# Patient Record
Sex: Male | Born: 1975 | Race: White | Hispanic: No | Marital: Married | Smoking: Never smoker
Health system: Southern US, Community
[De-identification: ages and names within clinical notes are randomized; demographics above are authoritative.]

## PROBLEM LIST (undated history)

## (undated) DIAGNOSIS — Z87442 Personal history of urinary calculi: Secondary | ICD-10-CM

## (undated) HISTORY — PX: NASAL SINUS SURGERY: SHX719

---
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
## 2007-04-30 ENCOUNTER — Ambulatory Visit: Payer: Self-pay | Admitting: Cardiology

## 2007-05-08 ENCOUNTER — Ambulatory Visit: Payer: Self-pay

## 2007-07-15 ENCOUNTER — Encounter: Payer: Self-pay | Admitting: Internal Medicine

## 2009-04-22 ENCOUNTER — Encounter: Admission: RE | Admit: 2009-04-22 | Discharge: 2009-04-22 | Payer: Self-pay | Admitting: Otolaryngology

## 2010-11-29 NOTE — Assessment & Plan Note (Signed)
[facility]                            CARDIOLOGY OFFICE NOTE   Mike Medina, Mike Medina                    MRN:          [id number]  DATE:04/30/2007                            DOB:          01/08/1976    CHIEF COMPLAINT:  Chest tightness and pressure nine months ago and  recently.   HISTORY OF PRESENT ILLNESS:  The patient is a very pleasant 35 year old  married white male who comes in with the above complaint.  About nine  months ago he had chest pressure and heaviness associated with feeling  very cold and shaky.  He did not bother to get this assessed.   He saw Dr. Patsy Lager yesterday at [facility] because of  recurrent chest pressure in the center of his chest that is not well  localized.  It comes on at rest.  It is not exertional related.  It does  not radiate.  It is not associated with any nausea, vomiting, or  diaphoresis.  He has no GI symptomatology including dyspepsia,  dysphagia, melena, or hematochezia.   He has virtually no risk factors at this point in time.  He is a little  bit overweight and he does not know his lipid status which was drawn  yesterday.  Specifically, he does not have hypertension, he does not  smoke, does not use recreational products.  He does not have a premature  family history and is not diabetic.   ALLERGIES:  No known drug allergies.   PAST SURGICAL HISTORY:  None.   FAMILY HISTORY:  Negative for premature coronary disease.   CURRENT MEDICATIONS:  Tylenol PM p.r.n.   SOCIAL HISTORY:  He is a Radiation protection practitioner.  He does retirement  planning.  He is obviously under a lot of stress at the present  conditions of our society.  He is married and has two children.   REVIEW OF SYSTEMS:  Other than the HPI is negative.   PHYSICAL EXAMINATION:  VITAL SIGNS:  Blood pressure 98/62, pulse 55 and  regular, EKG shows sinus brady with an SRS prime in only V1. There is no  change from his ECG at [facility].  He is 5 feet 11 inches and  weighs 200 pounds.  HEENT:  Normocephalic and atraumatic.  PERRLA.  Extraocular movements  intact.  Sclerae clear.  Facial symmetry is normal.  Dentition is  satisfactory.  NECK:  Supple.  Carotid upstrokes are equal bilaterally without bruits,  no JVD.  Thyroid is not enlarged.  Trachea is midline.  LUNGS:  Clear.  HEART:  Regular rate and rhythm.  Normal S1 and S2.  No click.  ABDOMEN:  Soft with good bowel sounds.  No midline bruits.  There is no  hepatomegaly.  There is no epigastric or right upper quadrant  tenderness.  EXTREMITIES:  No cyanosis, clubbing, or edema.  Pulses are intact.  NEUROLOGY:  Intact.  SKIN:  Unremarkable.   ASSESSMENT:  Chest tightness and pressure which is probably noncardiac.  No significant cardiac risk factors and this is not associated with  exertion.   He  is very concerned as is his wife.  I think objective assessment would  give him reassurance and also hopefully clear him to do an exercise  program.   PLAN:  Exercise rest stress Myoview.  If this is negative we will see  him back on a p.r.n. basis.  He will follow up his lipid status with Dr.  Patsy Lager.     Thomas C. Daleen Squibb, MD, [facility]  Electronically Signed    TCW/MedQ  DD: 04/30/2007  DT: 05/01/2007  Job #: [id number]   cc:   Pearline Cables, MD

## 2013-02-04 ENCOUNTER — Ambulatory Visit (INDEPENDENT_AMBULATORY_CARE_PROVIDER_SITE_OTHER): Payer: BC Managed Care – PPO | Admitting: Physician Assistant

## 2013-02-04 VITALS — BP 122/76 | HR 62 | Temp 98.4°F | Resp 18 | Ht 71.0 in | Wt 284.0 lb

## 2013-02-04 DIAGNOSIS — L03039 Cellulitis of unspecified toe: Secondary | ICD-10-CM

## 2013-02-04 DIAGNOSIS — L03031 Cellulitis of right toe: Secondary | ICD-10-CM

## 2013-02-04 MED ORDER — CEPHALEXIN 500 MG PO CAPS: 500.0000 mg | ORAL_CAPSULE | Freq: Three times a day (TID) | ORAL | Status: DC

## 2013-02-04 NOTE — Patient Instructions (Addendum)
Stop taking the amoxicillin.  Begin taking the Keflex three times daily.  Be sure to finish the full course as directed!  Continue soaking the toe - this will facilitate drainage of the area.  If anything is worsening or not improving, please let us  Know    Paronychia Paronychia is an inflammatory reaction involving the folds of the skin surrounding the fingernail. This is commonly caused by an infection in the skin around a nail. The most common cause of paronychia is frequent wetting of the hands (as seen with bartenders, food servers, nurses or others who wet their hands). This makes the skin around the fingernail susceptible to infection by bacteria (germs) or fungus. Other predisposing factors are:  Aggressive manicuring.  Nail biting.  Thumb sucking. The most common cause is a staphylococcal (a type of germ) infection, or a fungal (Candida) infection. When caused by a germ, it usually comes on suddenly with redness, swelling, pus and is often painful. It may get under the nail and form an abscess (collection of pus), or form an abscess around the nail. If the nail itself is infected with a fungus, the treatment is usually prolonged and may require oral medicine for up to one year. Your caregiver will determine the length of time treatment is required. The paronychia caused by bacteria (germs) may largely be avoided by not pulling on hangnails or picking at cuticles. When the infection occurs at the tips of the finger it is called felon. When the cause of paronychia is from the herpes simplex virus (HSV) it is called herpetic whitlow. TREATMENT  When an abscess is present treatment is often incision and drainage. This means that the abscess must be cut open so the pus can get out. When this is done, the following home care instructions should be followed. HOME CARE INSTRUCTIONS   It is important to keep the affected fingers very dry. Rubber or plastic gloves over cotton gloves should be used  whenever the hand must be placed in water.  Keep wound clean, dry and dressed as suggested by your caregiver between warm soaks or warm compresses.  Soak in warm water for fifteen to twenty minutes three to four times per day for bacterial infections. Fungal infections are very difficult to treat, so often require treatment for long periods of time.  For bacterial (germ) infections take antibiotics (medicine which kill germs) as directed and finish the prescription, even if the problem appears to be solved before the medicine is gone.  Only take over-the-counter or prescription medicines for pain, discomfort, or fever as directed by your caregiver. SEEK IMMEDIATE MEDICAL CARE IF:  You have redness, swelling, or increasing pain in the wound.  You notice pus coming from the wound.  You have a fever.  You notice a bad smell coming from the wound or dressing. Document Released: 12/27/2000 Document Revised: 09/25/2011 Document Reviewed: 08/28/2008 [facility] Patient Information 2014 [place], [place].

## 2013-02-04 NOTE — Progress Notes (Signed)
  Subjective:    Patient ID: Mike Medina, male    DOB: [birth year], 37 y.o.   MRN: [id number]  HPI   Mike Medina is a very pleasant 36 yr old male here with concern for infection in his right great toe.  It has been sore x 1 week.  Tenderness is mainly located at medial nail margin, but occasionally that whole toe is painful with throbbing into the foot - esp after he has been up on his feet all day.  Has been doing epsom salt soaks.  Has noted some purulent drainage.  Has never had anything like this before.  Does think he is improving somewhat.  Denies injury to the toe.  Denies ingrown toenails.      Review of Systems  Constitutional: Negative for fever and chills.  HENT: Negative.   Respiratory: Negative.   Cardiovascular: Negative.   Gastrointestinal: Negative.   Musculoskeletal: Positive for arthralgias (right great toe).  Skin: Positive for color change (right great toe).  Neurological: Negative.        Objective:   Physical Exam  Vitals reviewed. Constitutional: He is oriented to person, place, and time. He appears well-developed and well-nourished. No distress.  HENT:  Head: Normocephalic and atraumatic.  Eyes: Conjunctivae are normal. No scleral icterus.  Cardiovascular:  Pulses:      Dorsalis pedis pulses are 2+ on the right side, and 2+ on the left side.       Posterior tibial pulses are 2+ on the right side, and 2+ on the left side.  Pulmonary/Chest: Effort normal.  Musculoskeletal:       Right foot: He exhibits tenderness. He exhibits normal range of motion, no bony tenderness, no swelling and normal capillary refill.       Feet:  Area of tenderness, erythema, and swelling at medial nail border; no fluctuance or warmth; no evidence of ingrowing nails  Neurological: He is alert and oriented to person, place, and time.  Skin: Skin is warm and dry.  Psychiatric: He has a normal mood and affect. His behavior is normal.         Assessment & Plan:  Paronychia  of great toe of right foot - Plan: cephALEXin (KEFLEX) 500 MG capsule   Mike Medina is a 37 yr old male with paronychia of the right great toe.  Because there is no area of discrete fluctuance, I have opted not to open the area today.  Will start keflex TID.  Continue soaks.  Discussed RTC precautions with pt who understands and is in agreement with this plan

## 2016-02-25 ENCOUNTER — Emergency Department (HOSPITAL_COMMUNITY)
Admission: EM | Admit: 2016-02-25 | Discharge: 2016-02-25 | Disposition: A | Discharge status: Home / Self Care | Payer: BLUE CROSS/BLUE SHIELD | Attending: Emergency Medicine | Admitting: Emergency Medicine

## 2016-02-25 ENCOUNTER — Emergency Department (HOSPITAL_COMMUNITY): Payer: BLUE CROSS/BLUE SHIELD

## 2016-02-25 DIAGNOSIS — N1330 Unspecified hydronephrosis: Secondary | ICD-10-CM | POA: Insufficient documentation

## 2016-02-25 DIAGNOSIS — K8020 Calculus of gallbladder without cholecystitis without obstruction: Secondary | ICD-10-CM | POA: Insufficient documentation

## 2016-02-25 DIAGNOSIS — N201 Calculus of ureter: Secondary | ICD-10-CM | POA: Diagnosis not present

## 2016-02-25 DIAGNOSIS — N200 Calculus of kidney: Secondary | ICD-10-CM

## 2016-02-25 DIAGNOSIS — N134 Hydroureter: Secondary | ICD-10-CM | POA: Diagnosis not present

## 2016-02-25 DIAGNOSIS — K7689 Other specified diseases of liver: Secondary | ICD-10-CM

## 2016-02-25 DIAGNOSIS — R1031 Right lower quadrant pain: Secondary | ICD-10-CM | POA: Diagnosis present

## 2016-02-25 DIAGNOSIS — K760 Fatty (change of) liver, not elsewhere classified: Secondary | ICD-10-CM

## 2016-02-25 LAB — URINALYSIS, ROUTINE W REFLEX MICROSCOPIC
BILIRUBIN URINE: NEGATIVE
Glucose, UA: NEGATIVE mg/dL
HGB URINE DIPSTICK: NEGATIVE
Ketones, ur: NEGATIVE mg/dL
Leukocytes, UA: NEGATIVE
Nitrite: NEGATIVE
PH: 7.5 (ref 5.0–8.0)
Protein, ur: 30 mg/dL — AB
SPECIFIC GRAVITY, URINE: 1.027 (ref 1.005–1.030)

## 2016-02-25 LAB — COMPREHENSIVE METABOLIC PANEL
ALBUMIN: 4.9 g/dL (ref 3.5–5.0)
ALK PHOS: 62 U/L (ref 38–126)
ALT: 47 U/L (ref 17–63)
ANION GAP: 13 (ref 5–15)
AST: 33 U/L (ref 15–41)
BUN: 16 mg/dL (ref 6–20)
CALCIUM: 9.8 mg/dL (ref 8.9–10.3)
CO2: 23 mmol/L (ref 22–32)
Chloride: 106 mmol/L (ref 101–111)
Creatinine, Ser: 1.38 mg/dL — ABNORMAL HIGH (ref 0.61–1.24)
GFR calc Af Amer: >60 mL/min (ref >60)
GFR calc non Af Amer: >60 mL/min (ref >60)
GLUCOSE: 159 mg/dL — AB (ref 65–99)
Potassium: 3.8 mmol/L (ref 3.5–5.1)
SODIUM: 142 mmol/L (ref 135–145)
Total Bilirubin: 1 mg/dL (ref 0.3–1.2)
Total Protein: 7 g/dL (ref 6.5–8.1)

## 2016-02-25 LAB — CBC
HEMATOCRIT: 46.5 % (ref 39.0–52.0)
HEMOGLOBIN: 15.9 g/dL (ref 13.0–17.0)
MCH: 29.6 pg (ref 26.0–34.0)
MCHC: 34.2 g/dL (ref 30.0–36.0)
MCV: 86.4 fL (ref 78.0–100.0)
Platelets: 214 10*3/uL (ref 150–400)
RBC: 5.38 MIL/uL (ref 4.22–5.81)
RDW: 12.8 % (ref 11.5–15.5)
WBC: 7 10*3/uL (ref 4.0–10.5)

## 2016-02-25 LAB — LIPASE, BLOOD: Lipase: 30 U/L (ref 11–51)

## 2016-02-25 LAB — URINE MICROSCOPIC-ADD ON

## 2016-02-25 MED ORDER — OXYCODONE-ACETAMINOPHEN 5-325 MG PO TABS
ORAL_TABLET | ORAL | Status: AC
  Filled 2016-02-25: qty 1

## 2016-02-25 MED ORDER — ONDANSETRON 4 MG PO TBDP
4.0000 mg | ORAL_TABLET | Freq: Once | ORAL | Status: AC | PRN
  Administered 2016-02-25: 4 mg via ORAL

## 2016-02-25 MED ORDER — FENTANYL CITRATE (PF) 100 MCG/2ML IJ SOLN
50.0000 ug | Freq: Once | INTRAMUSCULAR | Status: AC
  Administered 2016-02-25: 50 ug via INTRAVENOUS

## 2016-02-25 MED ORDER — HYDROCODONE-ACETAMINOPHEN 5-325 MG PO TABS
1.0000 | ORAL_TABLET | Freq: Four times a day (QID) | ORAL | 0 refills | Status: DC | PRN
Start: 2016-02-25 — End: 2016-04-19

## 2016-02-25 MED ORDER — HYDROCODONE-ACETAMINOPHEN 5-325 MG PO TABS
1.0000 | ORAL_TABLET | Freq: Once | ORAL | Status: AC
  Administered 2016-02-25: 1 via ORAL
  Filled 2016-02-25: qty 1

## 2016-02-25 MED ORDER — ONDANSETRON 4 MG PO TBDP
ORAL_TABLET | ORAL | Status: AC
  Filled 2016-02-25: qty 1

## 2016-02-25 MED ORDER — FENTANYL CITRATE (PF) 100 MCG/2ML IJ SOLN
INTRAMUSCULAR | Status: AC
  Filled 2016-02-25: qty 2

## 2016-02-25 MED ORDER — ONDANSETRON HCL 4 MG/2ML IJ SOLN
4.0000 mg | Freq: Once | INTRAMUSCULAR | Status: AC
  Administered 2016-02-25: 4 mg via INTRAVENOUS
  Filled 2016-02-25: qty 2

## 2016-02-25 MED ORDER — KETOROLAC TROMETHAMINE 30 MG/ML IJ SOLN
30.0000 mg | Freq: Once | INTRAMUSCULAR | Status: AC
  Administered 2016-02-25: 30 mg via INTRAVENOUS
  Filled 2016-02-25: qty 1

## 2016-02-25 MED ORDER — OXYCODONE-ACETAMINOPHEN 5-325 MG PO TABS
1.0000 | ORAL_TABLET | ORAL | Status: DC | PRN
  Administered 2016-02-25: 1 via ORAL

## 2016-02-25 MED ORDER — HYDROMORPHONE HCL 1 MG/ML IJ SOLN
1.0000 mg | Freq: Once | INTRAMUSCULAR | Status: AC
  Administered 2016-02-25: 1 mg via INTRAVENOUS
  Filled 2016-02-25: qty 1

## 2016-02-25 MED ORDER — MORPHINE SULFATE (PF) 4 MG/ML IV SOLN
4.0000 mg | Freq: Once | INTRAVENOUS | Status: AC
  Administered 2016-02-25: 4 mg via INTRAVENOUS
  Filled 2016-02-25: qty 1

## 2016-02-25 MED ORDER — ONDANSETRON HCL 4 MG PO TABS: 4.0000 mg | ORAL_TABLET | Freq: Four times a day (QID) | ORAL | 0 refills | Status: DC

## 2016-02-25 NOTE — ED Provider Notes (Signed)
[facility]-EMERGENCY DEPT Provider Note   CSN: [id number] Arrival date & time: 02/25/16  1734  First Provider Contact:  First MD Initiated Contact with Patient 02/25/16 1859        History   Chief Complaint Chief Complaint  Patient presents with  . Abdominal Pain    HPI Mike Medina is a 40 y.o. male.  Patient presents to the ED with a chief complaint of right flank and abdominal pain that started at 3pm today.  He reports sudden onset, severe pain.  It radiates to his back, right flank, and suprapubic region.  He reports associated nausea and vomiting. He denies any hx of kidney stones.  He denies any fevers, chills, hematuria, dysuria.     The history is provided by the patient. No language interpreter was used.    No past medical history on file.  There are no active problems to display for this patient.   No past surgical history on file.     Home Medications    Prior to Admission medications   Medication Sig Start Date End Date Taking? Authorizing Provider  cephALEXin (KEFLEX) 500 MG capsule Take 1 capsule (500 mg total) by mouth 3 (three) times daily. 02/04/13   Godfrey Pick, PA-C    Family History Family History  Problem Relation Age of Onset  . Diabetes Father     Social History Social History  Substance Use Topics  . Smoking status: Never Smoker  . Smokeless tobacco: Not on file  . Alcohol use No     Allergies   Review of patient's allergies indicates no known allergies.   Review of Systems Review of Systems  Genitourinary: Positive for flank pain.  All other systems reviewed and are negative.    Physical Exam Updated Vital Signs BP 133/72 (BP Location: Right Arm)   Pulse 62   Temp 97.6 F (36.4 C) (Oral)   Resp 26   Ht  (1.803 m)   Wt 131.5 kg   SpO2 100%   BMI 40.45 kg/m   Physical Exam  Constitutional: He is oriented to person, place, and time. He appears well-developed and well-nourished.  Appears very  uncomfortable  HENT:  Head: Normocephalic and atraumatic.  Eyes: Conjunctivae and EOM are normal. Pupils are equal, round, and reactive to light. Right eye exhibits no discharge. Left eye exhibits no discharge. No scleral icterus.  Neck: Normal range of motion. Neck supple. No JVD present.  Cardiovascular: Normal rate, regular rhythm and normal heart sounds.  Exam reveals no gallop and no friction rub.   No murmur heard. Pulmonary/Chest: Effort normal and breath sounds normal. No respiratory distress. He has no wheezes. He has no rales. He exhibits no tenderness.  Abdominal: Soft. He exhibits no distension and no mass. There is tenderness. There is no rebound and no guarding.  Right CVA tenderness, right abdominal tenderness  Musculoskeletal: Normal range of motion. He exhibits no edema or tenderness.  Neurological: He is alert and oriented to person, place, and time.  Skin: Skin is warm and dry.  Psychiatric: He has a normal mood and affect. His behavior is normal. Judgment and thought content normal.  Nursing note and vitals reviewed.    ED Treatments / Results  Labs (all labs ordered are listed, but only abnormal results are displayed) Labs Reviewed  COMPREHENSIVE METABOLIC PANEL - Abnormal; Notable for the following:       Result Value   Glucose, Bld 159 (*)    Creatinine, Ser  1.38 (*)    All other components within normal limits  URINALYSIS, ROUTINE W REFLEX MICROSCOPIC (NOT [facility]) - Abnormal; Notable for the following:    Protein, ur 30 (*)    All other components within normal limits  URINE MICROSCOPIC-ADD ON - Abnormal; Notable for the following:    Squamous Epithelial / LPF 0-5 (*)    Bacteria, UA RARE (*)    All other components within normal limits  LIPASE, BLOOD  CBC    EKG  EKG Interpretation  Date/Time:  Friday February 25 2016 17:40:19 EDT Ventricular Rate:  70 PR Interval:  174 QRS Duration: 86 QT Interval:  394 QTC Calculation: 425 R Axis:   20 Text  Interpretation:  Normal sinus rhythm with sinus arrhythmia Normal ECG Confirmed by [facility] CenterMESNER MD, Barbara CowerJASON [phone]) on 02/25/2016 6:48:01 PM       Radiology No results found.  Procedures Procedures (including critical care time)  Medications Ordered in ED Medications  oxyCODONE-acetaminophen (PERCOCET/ROXICET) 5-325 MG per tablet 1 tablet (1 tablet Oral Given 02/25/16 1745)  ondansetron (ZOFRAN-ODT) 4 MG disintegrating tablet (not administered)  oxyCODONE-acetaminophen (PERCOCET/ROXICET) 5-325 MG per tablet (not administered)  fentaNYL (SUBLIMAZE) 100 MCG/2ML injection (not administered)  HYDROmorphone (DILAUDID) injection 1 mg (not administered)  ondansetron (ZOFRAN) injection 4 mg (not administered)  ondansetron (ZOFRAN-ODT) disintegrating tablet 4 mg (4 mg Oral Given 02/25/16 1745)  fentaNYL (SUBLIMAZE) injection 50 mcg (50 mcg Intravenous Given 02/25/16 1837)     Initial Impression / Assessment and Plan / ED Course  I have reviewed the triage vital signs and the nursing notes.  Pertinent labs & imaging results that were available during my care of the patient were reviewed by me and considered in my medical decision making (see chart for details).  Clinical Course    Patient with severe, sudden onset, right flank pain. Suspect kidney stone, will check CT.  CT remarkable for 2 mm kidney stone. Patient still having significant pain after 2 separate doses of 1 mg of Dilaudid.  Pain control achieved with Toradol and morphine.  Verified with Dr. Jackie PlumMesnor prior to giving Toradol, given the patient's creatinine is 1.38.  9:55 PM Patient is still feeling well. He is stable for discharge. UA is negative for infection. Recommend urology follow-up if the stone has not passed in 2 days. Patient understands and agrees the plan. Return precautions given.  Final Clinical Impressions(s) / ED Diagnoses   Final diagnoses:  Kidney stone  Calculus of gallbladder without cholecystitis without  obstruction  Hepatic cyst  Hepatic steatosis    New Prescriptions New Prescriptions   HYDROCODONE-ACETAMINOPHEN (NORCO/VICODIN) 5-325 MG TABLET    Take 1-2 tablets by mouth every 6 (six) hours as needed for severe pain.   ONDANSETRON (ZOFRAN) 4 MG TABLET    Take 1 tablet (4 mg total) by mouth every 6 (six) hours.     Mike HorsemanRobert Cochise Dinneen, PA-C 02/25/16 2155    Marily MemosJason Mesner, MD 02/26/16 Moses [email or web address]

## 2016-02-25 NOTE — ED Triage Notes (Signed)
Pt reports a sudden onset of RLQ abdominal pain radiating right flank. Pt denies dysuria, hematuria. Pt denies hx of the same. Pt reports vomiting x 3 today. Onset of pain started around 1530 today. Pain is constant

## 2016-02-25 NOTE — ED Notes (Signed)
Patient transported to CT 

## 2016-03-24 ENCOUNTER — Other Ambulatory Visit: Payer: Self-pay | Admitting: Internal Medicine

## 2016-03-27 ENCOUNTER — Other Ambulatory Visit: Payer: Self-pay | Admitting: Internal Medicine

## 2016-03-27 DIAGNOSIS — K8020 Calculus of gallbladder without cholecystitis without obstruction: Secondary | ICD-10-CM

## 2016-03-27 DIAGNOSIS — K760 Fatty (change of) liver, not elsewhere classified: Secondary | ICD-10-CM

## 2016-04-04 ENCOUNTER — Ambulatory Visit
Admission: RE | Admit: 2016-04-04 | Discharge: 2016-04-04 | Disposition: A | Discharge status: Home / Self Care | Payer: BLUE CROSS/BLUE SHIELD | Source: Ambulatory Visit | Attending: Internal Medicine | Admitting: Internal Medicine

## 2016-04-04 DIAGNOSIS — K8020 Calculus of gallbladder without cholecystitis without obstruction: Secondary | ICD-10-CM

## 2016-04-04 DIAGNOSIS — K760 Fatty (change of) liver, not elsewhere classified: Secondary | ICD-10-CM

## 2016-04-10 ENCOUNTER — Encounter: Payer: Self-pay | Admitting: Gastroenterology

## 2016-04-19 ENCOUNTER — Ambulatory Visit (INDEPENDENT_AMBULATORY_CARE_PROVIDER_SITE_OTHER): Payer: BLUE CROSS/BLUE SHIELD | Admitting: Gastroenterology

## 2016-04-19 ENCOUNTER — Other Ambulatory Visit (INDEPENDENT_AMBULATORY_CARE_PROVIDER_SITE_OTHER): Payer: BLUE CROSS/BLUE SHIELD

## 2016-04-19 ENCOUNTER — Encounter: Payer: Self-pay | Admitting: Gastroenterology

## 2016-04-19 VITALS — BP 130/84 | HR 74 | Ht 70.0 in | Wt 300.0 lb

## 2016-04-19 DIAGNOSIS — R1012 Left upper quadrant pain: Secondary | ICD-10-CM

## 2016-04-19 DIAGNOSIS — R1011 Right upper quadrant pain: Secondary | ICD-10-CM | POA: Diagnosis not present

## 2016-04-19 DIAGNOSIS — R932 Abnormal findings on diagnostic imaging of liver and biliary tract: Secondary | ICD-10-CM | POA: Diagnosis not present

## 2016-04-19 LAB — COMPREHENSIVE METABOLIC PANEL
ALT: 42 U/L (ref 0–53)
AST: 29 U/L (ref 0–37)
Albumin: 4.3 g/dL (ref 3.5–5.2)
Alkaline Phosphatase: 62 U/L (ref 39–117)
BUN: 12 mg/dL (ref 6–23)
CO2: 32 meq/L (ref 19–32)
Calcium: 9.1 mg/dL (ref 8.4–10.5)
Chloride: 103 mEq/L (ref 96–112)
Creatinine, Ser: 1.04 mg/dL (ref 0.40–1.50)
GFR: 83.15 mL/min (ref >60.00)
GLUCOSE: 93 mg/dL (ref 70–99)
POTASSIUM: 4.2 meq/L (ref 3.5–5.1)
SODIUM: 141 meq/L (ref 135–145)
Total Bilirubin: 0.7 mg/dL (ref 0.2–1.2)
Total Protein: 6.6 g/dL (ref 6.0–8.3)

## 2016-04-19 NOTE — Patient Instructions (Signed)
You will have labs checked today in the basement lab.  Please head down after you check out with the front desk  (cmet). Cut back on advil. Use tylenol more for your pain. Take H2 blocker (ranitidine 150mg  pills) whenever you take advil/aleve. Call if any questions or concerns.

## 2016-04-19 NOTE — Progress Notes (Signed)
HPI: This is a     who was referred to me by Dr. Renne Crigler to evaluate  abnormal liver on CT scan, ultrasound, intermittent abdominal pain   .    Chief complaint is intermittent abdominal pain, abnormal liver imaging  Had right kidney stone aug 2017; terrible pain.  This eventually passed.  CT scan during this time suggested fatty liver and cyst in the liver. See that below.  Never saw a urologist  but his primary care physician follow-up the liver imaging with an ultrasound. See that below.  He will intermittently have position related RUQ pains.  In the past week he's had LUQ pains, sharp for the past two nights.  This is also positional. Will have to walk around to ease it back.  Also has noticed bloating in late in the day.  Weight slowly up; had been 270 for years but then up to 300 recently (30 pounds in 2 years).  Takes ibuprofen daily for sinus pains.  Normally he will take 3 pills bid.  About 3-4 days per week.  Labs 02/2016: cbc, cmet normal except Cr 1.3  Not on GERD meds, only very rarely gets pyrois  CT scan (no contrast) abd/pelvis 02/2016 for lower abd pain, n/v; IMPRESSION:1. 2 mm calculus at the right ureterovesical junction associated with hydronephrosis and hydroureter. 2. No additional intrarenal calculi or renal masses. 3. Hepatic steatosis. 4. Right hepatic cyst. 5. Cholelithiasis. 6. Normal appendix.  [place] 03/2016: for RUQ pain; IMPRESSION: 1. The gallstone questioned by recent CT within the gallbladder is not definitely seen by ultrasound, but does appear by CT to represent a real finding. 2. Echogenic liver parenchyma consistent with fatty infiltration.    Review of systems: Pertinent positive and negative review of systems were noted in the above HPI section. Complete review of systems was performed and was otherwise normal.   History reviewed. No pertinent past medical history.  Past Surgical History:  Procedure Laterality Date  . KNEE SURGERY Right 1992     Current Outpatient Prescriptions  Medication Sig Dispense Refill  . ibuprofen (ADVIL,MOTRIN) 200 MG tablet Take 800 mg by mouth every 6 (six) hours as needed.    . loratadine (CLARITIN) 10 MG tablet Take 10 mg by mouth at bedtime.    Marland Kitchen LORazepam (ATIVAN) 0.5 MG tablet Take 0.5 mg by mouth daily as needed for anxiety.     . sertraline (ZOLOFT) 100 MG tablet Take 100 mg by mouth at bedtime.     No current facility-administered medications for this visit.     Allergies as of 04/19/2016  . (No Known Allergies)    Family History  Problem Relation Age of Onset  . Osteoporosis Mother   . Colitis Mother   . Diabetes Father     Social History   Social History  . Marital status: Married    Spouse name: N/A  . Number of children: 2  . Years of education: N/A   Occupational History  . finacial advisor    Social History Main Topics  . Smoking status: Never Smoker  . Smokeless tobacco: Never Used  . Alcohol use No  . Drug use: No  . Sexual activity: Yes   Other Topics Concern  . Not on file   Social History Narrative  . No narrative on file    Physical Exam: BP 130/84   Pulse 74   Ht 5\' 10"  (1.778 m)   Wt 300 lb (136.1 kg)   BMI 43.05 kg/m  Constitutional: generally well-appearing Psychiatric: alert and oriented x3 Eyes: extraocular movements intact Mouth: oral pharynx moist, no lesions Neck: supple no lymphadenopathy Cardiovascular: heart regular rate and rhythm Lungs: clear to auscultation bilaterally Abdomen: soft, nontender, nondistended, no obvious ascites, no peritoneal signs, normal bowel sounds Extremities: no lower extremity edema bilaterally Skin: no lesions on visible extremities  Assessment and plan: 40 y.o. male with  abnormal liver on CT scan, ultrasound, intermittent left upper quadrant pains, morbid obesity  First he does have fatty liver on both CT scan and ultrasound. Fortunately based on complete metabolic profile he has no sign of liver  irritation, inflammation due to the fatty liver. His morbid obesity is certainly cause. We had a long discussion about gradual weight loss. He drinks 2 nondiet sodas a day and have was snacks after dinner often. I recommended he cut out those 2 habits and his goal should be 20-30 pounds less in 2 years.   He probably does have gallstones based on imaging but these are not causing him any symptoms. His right upper quadrant pain is very positional . His left upper quadrant pain is also positional but I do not think it is related to gallstones either. He takes 6 Advil 3 or 4 times a week and I explained to him that this is certainly a risk for gastric irritation, dyspepsia, ulcers. I recommended he try to cut back on this is best that he can, substituting a Tylenol when possible. I also recommended that if he is going to be taking NSAIDs on more than just a once a week basis he should at the same time taking H2 blocker. I see no reason for endoscopic imaging at this time. I will contact him after his complete metabolic profile is back. He is to follow-up as needed.   Rob Buntinganiel Fayne Mcguffee, MD [facility] 04/19/2016, 9:54 AM  Cc: Dr. Renne CriglerPHarr    .[email or web address]

## 2017-07-18 DIAGNOSIS — F419 Anxiety disorder, unspecified: Secondary | ICD-10-CM | POA: Diagnosis not present

## 2017-07-18 DIAGNOSIS — J309 Allergic rhinitis, unspecified: Secondary | ICD-10-CM | POA: Diagnosis not present

## 2017-10-10 ENCOUNTER — Emergency Department (HOSPITAL_COMMUNITY): Payer: BLUE CROSS/BLUE SHIELD

## 2017-10-10 ENCOUNTER — Other Ambulatory Visit: Payer: Self-pay

## 2017-10-10 ENCOUNTER — Emergency Department (HOSPITAL_COMMUNITY)
Admission: EM | Admit: 2017-10-10 | Discharge: 2017-10-10 | Disposition: A | Discharge status: Home / Self Care | Payer: BLUE CROSS/BLUE SHIELD | Attending: Physician Assistant | Admitting: Physician Assistant

## 2017-10-10 ENCOUNTER — Encounter (HOSPITAL_COMMUNITY): Payer: Self-pay | Admitting: Emergency Medicine

## 2017-10-10 DIAGNOSIS — R1012 Left upper quadrant pain: Secondary | ICD-10-CM | POA: Diagnosis not present

## 2017-10-10 DIAGNOSIS — N134 Hydroureter: Secondary | ICD-10-CM | POA: Diagnosis not present

## 2017-10-10 DIAGNOSIS — N1330 Unspecified hydronephrosis: Secondary | ICD-10-CM | POA: Diagnosis not present

## 2017-10-10 DIAGNOSIS — R1032 Left lower quadrant pain: Secondary | ICD-10-CM | POA: Diagnosis not present

## 2017-10-10 DIAGNOSIS — N201 Calculus of ureter: Secondary | ICD-10-CM | POA: Diagnosis not present

## 2017-10-10 DIAGNOSIS — R10812 Left upper quadrant abdominal tenderness: Secondary | ICD-10-CM | POA: Diagnosis not present

## 2017-10-10 DIAGNOSIS — N200 Calculus of kidney: Secondary | ICD-10-CM | POA: Diagnosis not present

## 2017-10-10 DIAGNOSIS — R1110 Vomiting, unspecified: Secondary | ICD-10-CM | POA: Diagnosis not present

## 2017-10-10 DIAGNOSIS — R112 Nausea with vomiting, unspecified: Secondary | ICD-10-CM | POA: Insufficient documentation

## 2017-10-10 DIAGNOSIS — R109 Unspecified abdominal pain: Secondary | ICD-10-CM | POA: Diagnosis not present

## 2017-10-10 LAB — URINALYSIS, ROUTINE W REFLEX MICROSCOPIC
Bilirubin Urine: NEGATIVE
Glucose, UA: NEGATIVE mg/dL
HGB URINE DIPSTICK: NEGATIVE
KETONES UR: 5 mg/dL — AB
LEUKOCYTES UA: NEGATIVE
NITRITE: NEGATIVE
PH: 5 (ref 5.0–8.0)
PROTEIN: 30 mg/dL — AB
Specific Gravity, Urine: 1.028 (ref 1.005–1.030)
WBC UA: NONE SEEN WBC/hpf (ref 0–5)

## 2017-10-10 LAB — CBC
HCT: 46.5 % (ref 39.0–52.0)
HEMOGLOBIN: 15.9 g/dL (ref 13.0–17.0)
MCH: 29.6 pg (ref 26.0–34.0)
MCHC: 34.2 g/dL (ref 30.0–36.0)
MCV: 86.6 fL (ref 78.0–100.0)
Platelets: 226 10*3/uL (ref 150–400)
RBC: 5.37 MIL/uL (ref 4.22–5.81)
RDW: 13.2 % (ref 11.5–15.5)
WBC: 9.2 10*3/uL (ref 4.0–10.5)

## 2017-10-10 LAB — LIPASE, BLOOD: Lipase: 25 U/L (ref 11–51)

## 2017-10-10 LAB — COMPREHENSIVE METABOLIC PANEL
ALK PHOS: 78 U/L (ref 38–126)
ALT: 55 U/L (ref 17–63)
ANION GAP: 14 (ref 5–15)
AST: 45 U/L — ABNORMAL HIGH (ref 15–41)
Albumin: 4.9 g/dL (ref 3.5–5.0)
BILIRUBIN TOTAL: 1.7 mg/dL — AB (ref 0.3–1.2)
BUN: 22 mg/dL — ABNORMAL HIGH (ref 6–20)
CO2: 17 mmol/L — AB (ref 22–32)
Calcium: 9.5 mg/dL (ref 8.9–10.3)
Chloride: 109 mmol/L (ref 101–111)
Creatinine, Ser: 1.46 mg/dL — ABNORMAL HIGH (ref 0.61–1.24)
GFR, EST NON AFRICAN AMERICAN: 57 mL/min — AB (ref >60)
Glucose, Bld: 148 mg/dL — ABNORMAL HIGH (ref 65–99)
Potassium: 3.5 mmol/L (ref 3.5–5.1)
SODIUM: 140 mmol/L (ref 135–145)
TOTAL PROTEIN: 7.7 g/dL (ref 6.5–8.1)

## 2017-10-10 MED ORDER — KETOROLAC TROMETHAMINE 30 MG/ML IJ SOLN
30.0000 mg | Freq: Once | INTRAMUSCULAR | Status: AC
Start: 2017-10-10 — End: 2017-10-10
  Administered 2017-10-10: 30 mg via INTRAVENOUS
  Filled 2017-10-10: qty 1

## 2017-10-10 MED ORDER — IOPAMIDOL (ISOVUE-300) INJECTION 61%
INTRAVENOUS | Status: AC
  Filled 2017-10-10: qty 100

## 2017-10-10 MED ORDER — SODIUM CHLORIDE 0.9 % IV BOLUS
1000.0000 mL | Freq: Once | INTRAVENOUS | Status: AC
  Administered 2017-10-10: 1000 mL via INTRAVENOUS

## 2017-10-10 MED ORDER — ONDANSETRON HCL 4 MG/2ML IJ SOLN
4.0000 mg | Freq: Once | INTRAMUSCULAR | Status: AC
  Administered 2017-10-10: 4 mg via INTRAVENOUS
  Filled 2017-10-10: qty 2

## 2017-10-10 MED ORDER — OXYCODONE-ACETAMINOPHEN 5-325 MG PO TABS
1.0000 | ORAL_TABLET | Freq: Once | ORAL | Status: AC
  Administered 2017-10-10: 1 via ORAL
  Filled 2017-10-10: qty 1

## 2017-10-10 MED ORDER — ONDANSETRON 4 MG PO TBDP
4.0000 mg | ORAL_TABLET | Freq: Once | ORAL | Status: AC | PRN
  Administered 2017-10-10: 4 mg via ORAL
  Filled 2017-10-10: qty 1

## 2017-10-10 MED ORDER — ONDANSETRON 4 MG PO TBDP: 4.0000 mg | ORAL_TABLET | Freq: Three times a day (TID) | ORAL | 0 refills | Status: AC | PRN

## 2017-10-10 MED ORDER — OXYCODONE-ACETAMINOPHEN 5-325 MG PO TABS: 1.0000 | ORAL_TABLET | Freq: Four times a day (QID) | ORAL | 0 refills | Status: AC | PRN

## 2017-10-10 MED ORDER — IOPAMIDOL (ISOVUE-300) INJECTION 61%
100.0000 mL | Freq: Once | INTRAVENOUS | Status: AC | PRN
  Administered 2017-10-10: 100 mL via INTRAVENOUS

## 2017-10-10 MED ORDER — MORPHINE SULFATE (PF) 4 MG/ML IV SOLN
4.0000 mg | Freq: Once | INTRAVENOUS | Status: AC
  Administered 2017-10-10: 4 mg via INTRAVENOUS
  Filled 2017-10-10: qty 1

## 2017-10-10 MED ORDER — TAMSULOSIN HCL 0.4 MG PO CAPS: 0.4000 mg | ORAL_CAPSULE | Freq: Every day | ORAL | 0 refills | Status: AC

## 2017-10-10 NOTE — ED Provider Notes (Signed)
[facility]-EMERGENCY DEPT Provider Note   CSN: [id number] Arrival date & time: 10/10/17  0915     History   Chief Complaint Chief Complaint  Patient presents with  . Abdominal Pain  . Emesis    HPI Mike Medina is a 42 y.o. male.  HPI   Patient is a 42 year old male presenting with abdominal pain.  Patient reports left-sided flank and left lower quadrant pain.  He says his report started 2 days ago.  Had intense pain 2 days ago, resolved yesterday and then got worse again today.  Patient's had multiple episodes of vomiting.  Patient has history of kidney stones but he states this feels different.   History reviewed. No pertinent past medical history.  There are no active problems to display for this patient.   Past Surgical History:  Procedure Laterality Date  . KNEE SURGERY Right 1992        Home Medications    Prior to Admission medications   Medication Sig Start Date End Date Taking? Authorizing Provider  acetaminophen (TYLENOL) 500 MG tablet Take 1,000 mg by mouth daily as needed for mild pain or headache.   Yes [provider]  sertraline (ZOLOFT) 100 MG tablet Take 100 mg by mouth at bedtime. 02/17/16  Yes [provider]    Family History Family History  Problem Relation Age of Onset  . Osteoporosis Mother   . Colitis Mother   . Diabetes Father     Social History Social History   Tobacco Use  . Smoking status: Never Smoker  . Smokeless tobacco: Never Used  Substance Use Topics  . Alcohol use: No  . Drug use: No     Allergies   Patient has no known allergies.   Review of Systems Review of Systems  Constitutional: Negative for activity change, fatigue and fever.  Respiratory: Negative for shortness of breath.   Cardiovascular: Negative for chest pain.  Gastrointestinal: Positive for nausea and vomiting. Negative for abdominal pain.  All other systems reviewed and are negative.    Physical  Exam Updated Vital Signs BP (!) 164/96   Pulse 77   Temp 98.1 F (36.7 C) (Oral)   Resp 20   SpO2 100%   Physical Exam  Constitutional: He is oriented to person, place, and time. He appears well-nourished.  HENT:  Head: Normocephalic.  Eyes: Conjunctivae are normal.  Cardiovascular: Normal rate.  Pulmonary/Chest: Effort normal.  Abdominal: There is tenderness in the left upper quadrant and left lower quadrant. There is no rigidity and no guarding.  Neurological: He is oriented to person, place, and time.  Skin: Skin is warm and dry. He is not diaphoretic.  Psychiatric: He has a normal mood and affect. His behavior is normal.  Nursing note and vitals reviewed.    ED Treatments / Results  Labs (all labs ordered are listed, but only abnormal results are displayed) Labs Reviewed  COMPREHENSIVE METABOLIC PANEL - Abnormal; Notable for the following components:      Result Value   CO2 17 (*)    Glucose, Bld 148 (*)    BUN 22 (*)    Creatinine, Ser 1.46 (*)    AST 45 (*)    Total Bilirubin 1.7 (*)    GFR calc non Af Amer 57 (*)    All other components within normal limits  URINALYSIS, ROUTINE W REFLEX MICROSCOPIC - Abnormal; Notable for the following components:   APPearance TURBID (*)    Ketones, ur  5 (*)    Protein, ur 30 (*)    Bacteria, UA RARE (*)    Squamous Epithelial / LPF 0-5 (*)    All other components within normal limits  LIPASE, BLOOD  CBC    EKG None  Radiology No results found.  Procedures Procedures (including critical care time)  Medications Ordered in ED Medications  sodium chloride 0.9 % bolus 1,000 mL (has no administration in time range)  morphine 4 MG/ML injection 4 mg (has no administration in time range)  ondansetron (ZOFRAN) injection 4 mg (has no administration in time range)  ondansetron (ZOFRAN-ODT) disintegrating tablet 4 mg (4 mg Oral Given 10/10/17 1610)     Initial Impression / Assessment and Plan / ED Course  I have reviewed  the triage vital signs and the nursing notes.  Pertinent labs & imaging results that were available during my care of the patient were reviewed by me and considered in my medical decision making (see chart for details).      Patient is a 42 year old male presenting with abdominal pain.  Patient reports left-sided flank and left lower quadrant pain.  He says his report started 2 days ago.  Had intense pain 2 days ago, resolved yesterday and then got worse again today.  Patient's had multiple episodes of vomiting.  Patient has history of kidney stones but he states this feels different.  2:07 PM Will give fluids, nausea medicine, pain medicine.  CT abdomen to look left lower quadrant.  3:51 PM CT shows distal 3 mm stone which explain patient's symptoms.  Patient had them before, instructions given again.  We will give ketorolac, and discharge patient home with normal vital signs and prescriptions to help with symptomatic care for the stone at home.  Final Clinical Impressions(s) / ED Diagnoses   Final diagnoses:  None    ED Discharge Orders    None       Lanie Schelling, Cindee Salt, MD 10/10/17 1551

## 2017-10-10 NOTE — Discharge Instructions (Addendum)
Please follow-up with a urologist.  As we discussed, kidney stones or not inherently dangerous unless they become infected.  Please return with any high fevers, or other complaints.  Please follow-up with urology.

## 2017-10-10 NOTE — ED Triage Notes (Signed)
Pt complaint of left abdominal pain with some radiating to back; associated n/v; onset of all symptoms Monday; hx of kidney stone "but they have never lasted this long."

## 2017-10-12 DIAGNOSIS — N202 Calculus of kidney with calculus of ureter: Secondary | ICD-10-CM | POA: Diagnosis not present

## 2017-10-23 DIAGNOSIS — N202 Calculus of kidney with calculus of ureter: Secondary | ICD-10-CM | POA: Diagnosis not present

## 2017-10-23 DIAGNOSIS — R8271 Bacteriuria: Secondary | ICD-10-CM | POA: Diagnosis not present

## 2017-10-24 ENCOUNTER — Other Ambulatory Visit: Payer: Self-pay | Admitting: Urology

## 2017-10-25 NOTE — Progress Notes (Signed)
Please sign lithotripsy orders in epic; pt is scheduled for procedure on Monday 10/29/2017.  Also do you want the pt to have a prophylatic antibiotic prior to the procedure? Please place order in epic if so. Thanks.

## 2017-10-25 NOTE — Progress Notes (Signed)
Spoke with Pam with [facility] in regards to no antibiotic order noted in epic.

## 2017-10-26 ENCOUNTER — Encounter (HOSPITAL_COMMUNITY): Payer: Self-pay | Admitting: *Deleted

## 2017-10-26 ENCOUNTER — Other Ambulatory Visit: Payer: Self-pay

## 2017-10-26 ENCOUNTER — Other Ambulatory Visit: Payer: Self-pay | Admitting: Urology

## 2017-10-29 ENCOUNTER — Ambulatory Visit (HOSPITAL_COMMUNITY): Admission: RE | Admit: 2017-10-29 | Payer: BLUE CROSS/BLUE SHIELD | Source: Ambulatory Visit | Admitting: Urology

## 2017-10-29 HISTORY — DX: Personal history of urinary calculi: Z87.442

## 2017-10-29 SURGERY — LITHOTRIPSY, ESWL
Anesthesia: LOCAL | Laterality: Left

## 2018-02-01 DIAGNOSIS — K760 Fatty (change of) liver, not elsewhere classified: Secondary | ICD-10-CM | POA: Diagnosis not present

## 2018-02-01 DIAGNOSIS — F419 Anxiety disorder, unspecified: Secondary | ICD-10-CM | POA: Diagnosis not present

## 2018-02-14 DIAGNOSIS — E669 Obesity, unspecified: Secondary | ICD-10-CM | POA: Diagnosis not present

## 2018-02-14 DIAGNOSIS — Z0000 Encounter for general adult medical examination without abnormal findings: Secondary | ICD-10-CM | POA: Diagnosis not present

## 2018-06-25 DIAGNOSIS — J0100 Acute maxillary sinusitis, unspecified: Secondary | ICD-10-CM | POA: Diagnosis not present

## 2018-06-29 DIAGNOSIS — R040 Epistaxis: Secondary | ICD-10-CM | POA: Diagnosis not present

## 2018-06-29 DIAGNOSIS — J0100 Acute maxillary sinusitis, unspecified: Secondary | ICD-10-CM | POA: Diagnosis not present

## 2018-09-20 DIAGNOSIS — R5381 Other malaise: Secondary | ICD-10-CM | POA: Diagnosis not present

## 2018-09-20 DIAGNOSIS — J309 Allergic rhinitis, unspecified: Secondary | ICD-10-CM | POA: Diagnosis not present

## 2018-09-20 DIAGNOSIS — J9801 Acute bronchospasm: Secondary | ICD-10-CM | POA: Diagnosis not present

## 2018-09-20 DIAGNOSIS — J0100 Acute maxillary sinusitis, unspecified: Secondary | ICD-10-CM | POA: Diagnosis not present

## 2018-09-20 DIAGNOSIS — Z6841 Body Mass Index (BMI) 40.0 and over, adult: Secondary | ICD-10-CM | POA: Diagnosis not present

## 2019-02-08 IMAGING — CT CT ABD-PELV W/ CM
2 of 5 series · 16 of 46 positions shown, 18 images · IV contrast (ISOVUE)
Comparison: 02/25/2016

CLINICAL DATA: LEFT side abdominal pain radiating to back,
associated nausea and vomiting, symptoms since [REDACTED], history of
kidney stones, question diverticulitis

EXAM:
CT ABDOMEN AND PELVIS WITH CONTRAST
TECHNIQUE: Multidetector CT imaging of the abdomen and pelvis was performed
using the standard protocol following bolus administration of
intravenous contrast. Sagittal and coronal MPR images reconstructed
from axial data set.
CONTRAST:  100mL GH3AYD-KGG IOPAMIDOL (GH3AYD-KGG) INJECTION 61% IV.
No oral contrast.

[Series 2: axial st · axial · 0.88mm/px · z∈[-402,-12]mm · 13 of 92 slices shown, 15 images]
[im 7/92  soft-tissue]
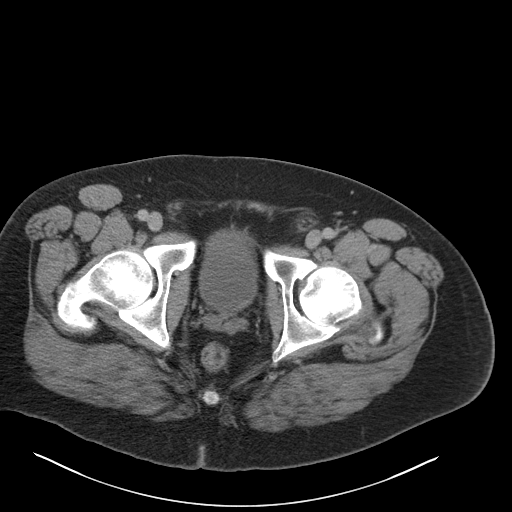
[im 7/92  bone]
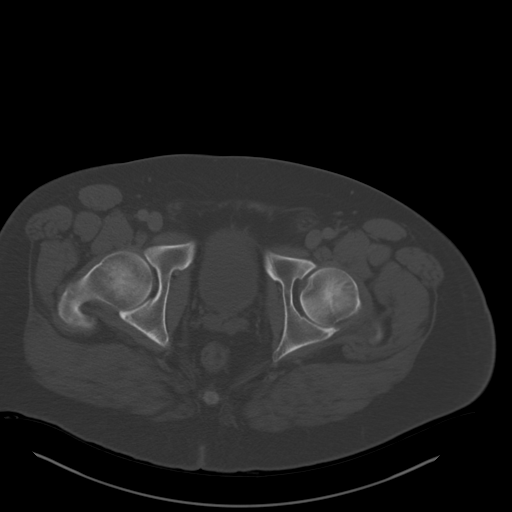
[im 14/92  soft-tissue]
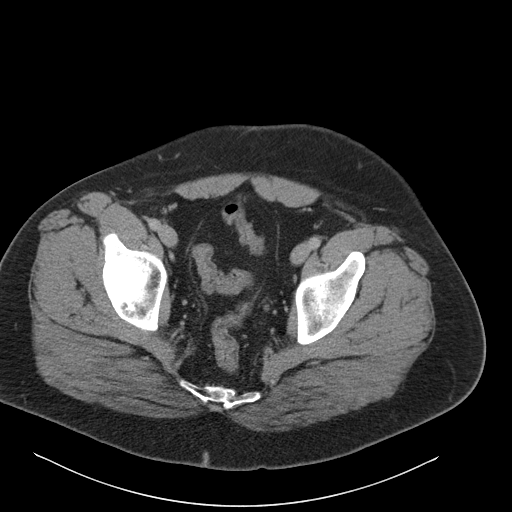
[im 20/92  soft-tissue]
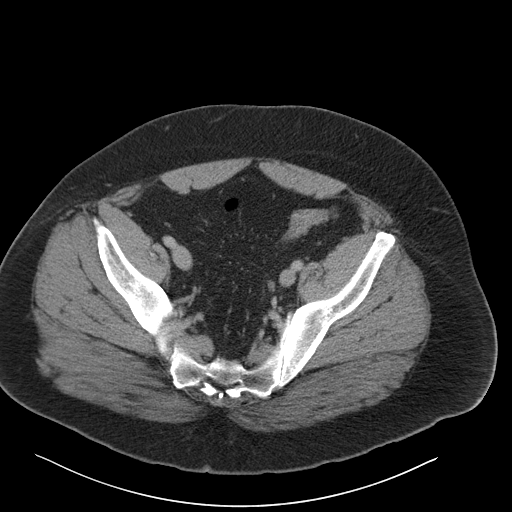
[im 27/92  soft-tissue]
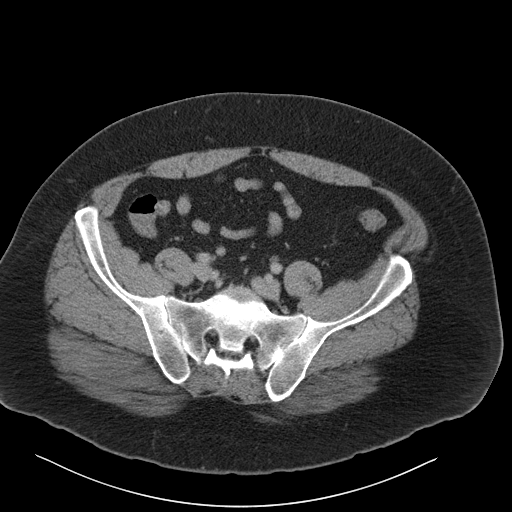
[im 33/92  soft-tissue]
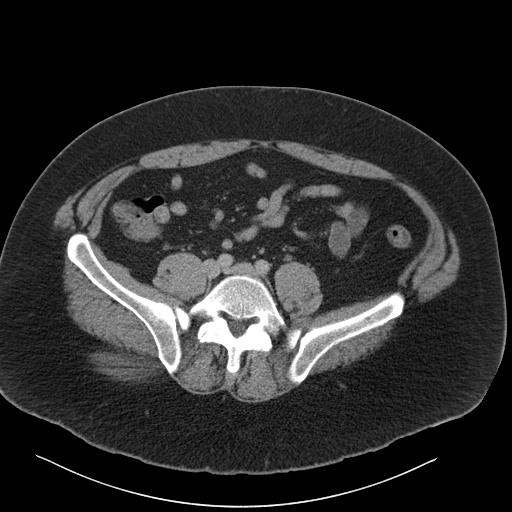
[im 40/92  soft-tissue]
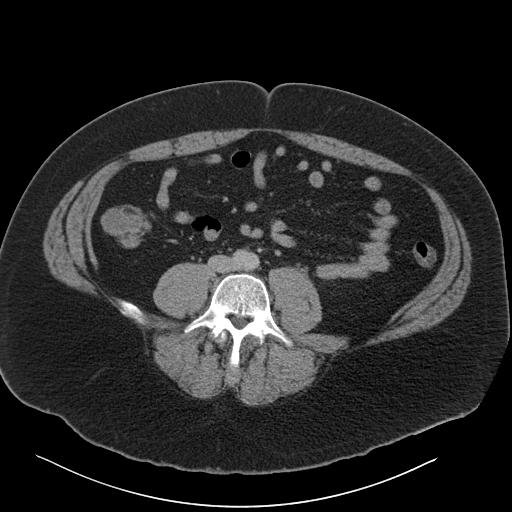
[im 46/92  soft-tissue]
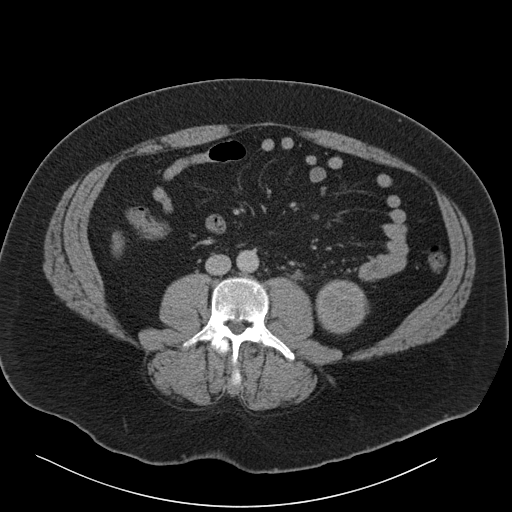
[im 53/92  soft-tissue]
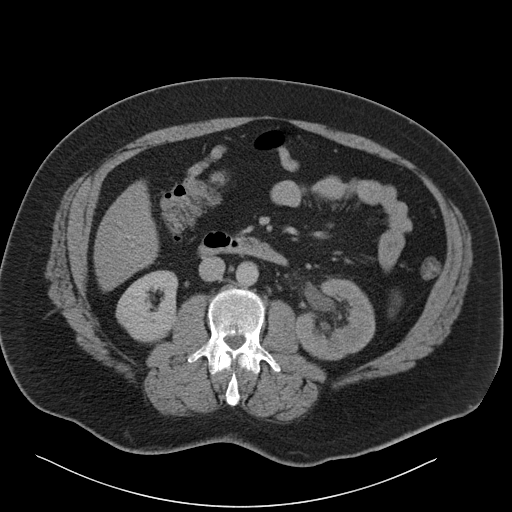
[im 59/92  soft-tissue]
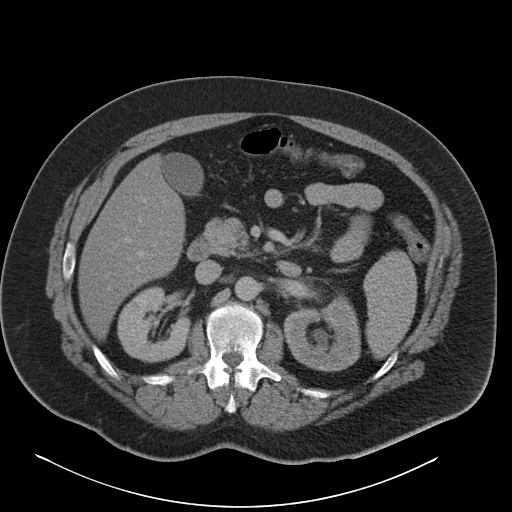
[im 59/92  bone]
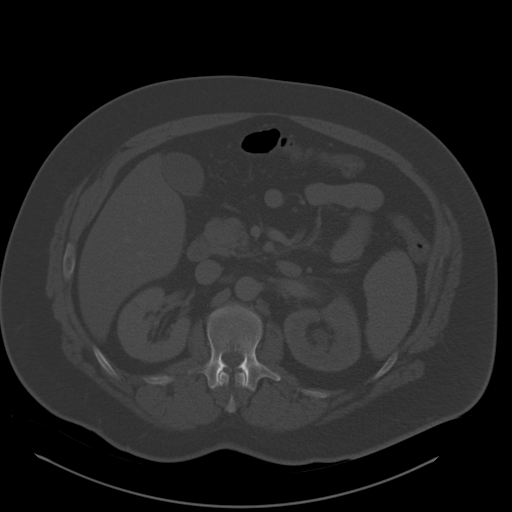
[im 66/92  soft-tissue]
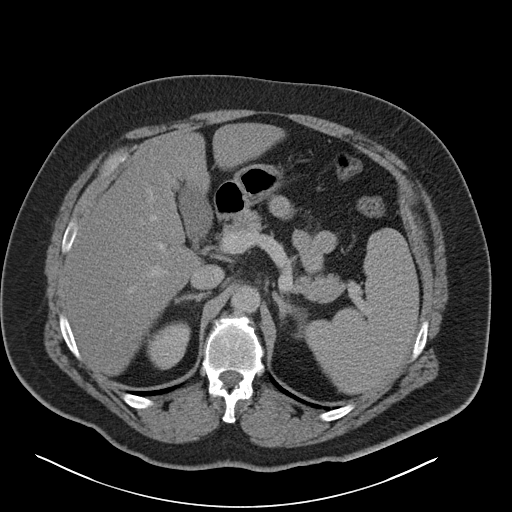
[im 72/92  soft-tissue]
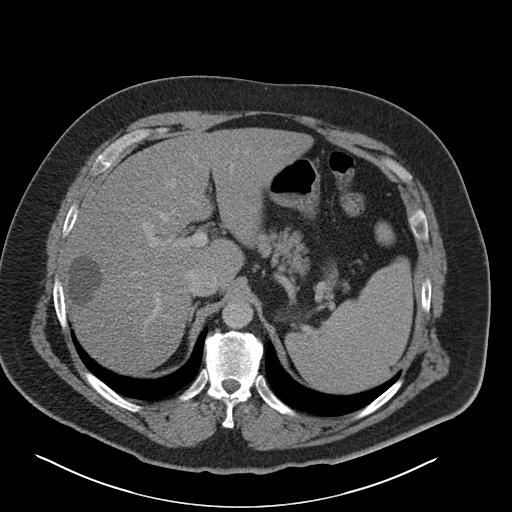
[im 79/92  soft-tissue]
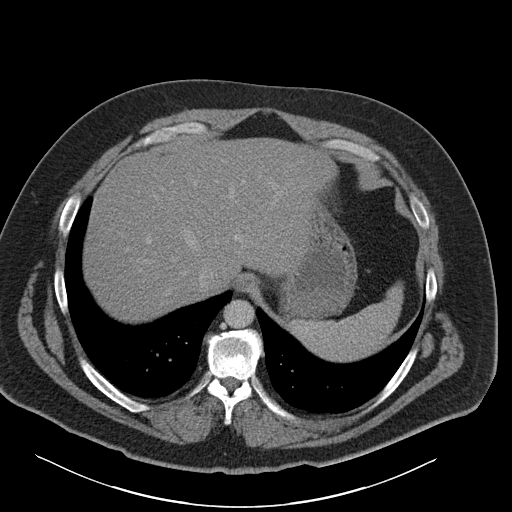
[im 85/92  soft-tissue]
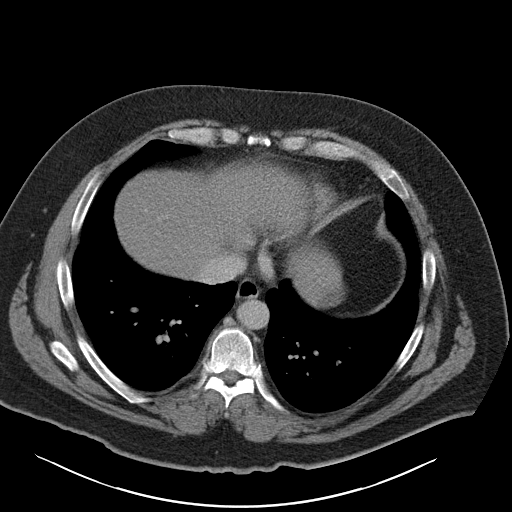

[Series 4: coronal st · coronal · 0.96mm/px · 3 of 96 slices shown]
[im 32/96  soft-tissue]
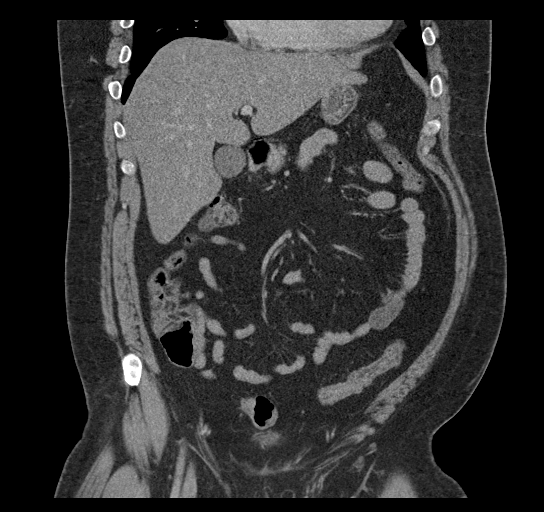
[im 43/96  soft-tissue]
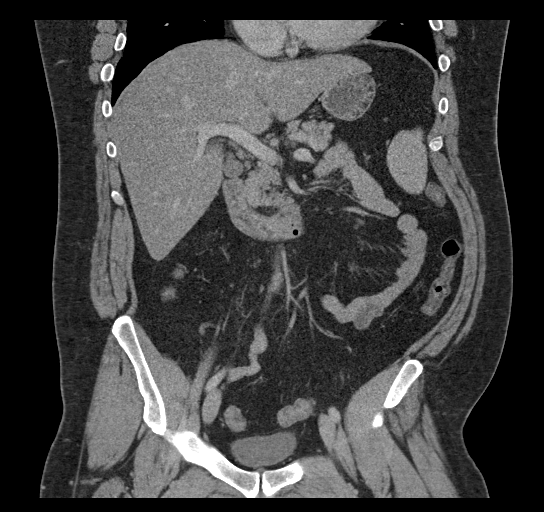
[im 53/96  soft-tissue]
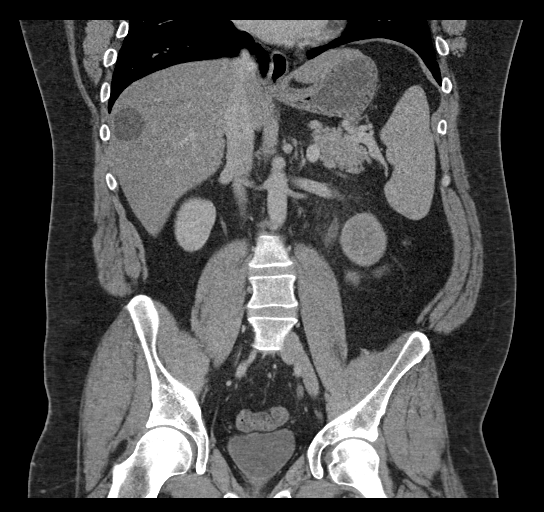

[16 of 46 positions shown; findings below may reference images not displayed]

FINDINGS: Lower chest: Lung bases clear

Hepatobiliary: Mild fatty infiltration of liver. Cyst at lateral
aspect RIGHT lobe liver 4.5 x 3.1 cm image 21, stable. Gallbladder
and liver otherwise normal appearance.

Pancreas: Normal appearance

Spleen: Normal appearance

Adrenals/Urinary Tract: Adrenal glands normal appearance. Small cyst
RIGHT kidney. Tiny nonobstructing calculus LEFT kidney. LEFT kidney
slightly enlarged with minimal perinephric edema and slight delay in
LEFT nephrogram. Mild LEFT hydronephrosis and hydroureter secondary
to a 3 mm calculus at the LEFT ureterovesical junction. Bladder and
RIGHT ureter normal appearance.

Stomach/Bowel: Normal appendix. Stomach and bowel loops normal
appearance.

Vascular/Lymphatic: Aorta normal caliber. Vascular structures
unremarkable. No adenopathy.

Reproductive: Minimal nonspecific prostatic calcification. Prostate
gland normal size.

Other: No free air or free fluid. No hernia or inflammatory process.

Musculoskeletal: Degenerative disc disease changes lower thoracic
spine. Osseous structures otherwise unremarkable
IMPRESSION: LEFT hydronephrosis, hydroureter and delay in LEFT nephrogram
secondary to a 3 mm calculus at the LEFT ureterovesical junction.

Additional tiny nonobstructing calculus at upper pole LEFT kidney.

## 2019-02-10 DIAGNOSIS — H903 Sensorineural hearing loss, bilateral: Secondary | ICD-10-CM | POA: Diagnosis not present

## 2019-02-10 DIAGNOSIS — H9313 Tinnitus, bilateral: Secondary | ICD-10-CM | POA: Diagnosis not present

## 2019-02-10 DIAGNOSIS — J324 Chronic pansinusitis: Secondary | ICD-10-CM | POA: Diagnosis not present

## 2019-02-12 DIAGNOSIS — E669 Obesity, unspecified: Secondary | ICD-10-CM | POA: Diagnosis not present

## 2019-02-12 DIAGNOSIS — H9313 Tinnitus, bilateral: Secondary | ICD-10-CM | POA: Diagnosis not present

## 2019-02-12 DIAGNOSIS — Z0000 Encounter for general adult medical examination without abnormal findings: Secondary | ICD-10-CM | POA: Diagnosis not present

## 2019-02-12 DIAGNOSIS — H903 Sensorineural hearing loss, bilateral: Secondary | ICD-10-CM | POA: Diagnosis not present

## 2019-02-12 DIAGNOSIS — R5383 Other fatigue: Secondary | ICD-10-CM | POA: Diagnosis not present

## 2019-02-12 DIAGNOSIS — Z125 Encounter for screening for malignant neoplasm of prostate: Secondary | ICD-10-CM | POA: Diagnosis not present

## 2019-02-12 DIAGNOSIS — K760 Fatty (change of) liver, not elsewhere classified: Secondary | ICD-10-CM | POA: Diagnosis not present

## 2019-02-19 DIAGNOSIS — Z0000 Encounter for general adult medical examination without abnormal findings: Secondary | ICD-10-CM | POA: Diagnosis not present

## 2019-06-04 DIAGNOSIS — H524 Presbyopia: Secondary | ICD-10-CM | POA: Diagnosis not present

## 2019-09-18 DIAGNOSIS — H66016 Acute suppurative otitis media with spontaneous rupture of ear drum, recurrent, bilateral: Secondary | ICD-10-CM | POA: Diagnosis not present

## 2019-09-18 DIAGNOSIS — J0111 Acute recurrent frontal sinusitis: Secondary | ICD-10-CM | POA: Diagnosis not present

## 2022-04-25 ENCOUNTER — Other Ambulatory Visit (HOSPITAL_COMMUNITY): Payer: Self-pay | Admitting: Registered Nurse

## 2022-04-25 ENCOUNTER — Other Ambulatory Visit: Payer: Self-pay | Admitting: Registered Nurse

## 2022-04-25 DIAGNOSIS — I10 Essential (primary) hypertension: Secondary | ICD-10-CM

## 2022-05-08 ENCOUNTER — Ambulatory Visit (HOSPITAL_COMMUNITY)
Admission: RE | Admit: 2022-05-08 | Discharge: 2022-05-08 | Disposition: A | Discharge status: Home / Self Care | Payer: BLUE CROSS/BLUE SHIELD | Source: Ambulatory Visit | Attending: Registered Nurse | Admitting: Registered Nurse

## 2022-05-08 DIAGNOSIS — I10 Essential (primary) hypertension: Secondary | ICD-10-CM | POA: Insufficient documentation

## 2024-04-01 DIAGNOSIS — R059 Cough, unspecified: Secondary | ICD-10-CM | POA: Diagnosis not present

## 2024-04-01 DIAGNOSIS — U071 COVID-19: Secondary | ICD-10-CM | POA: Diagnosis not present

## 2024-05-05 DIAGNOSIS — I2510 Atherosclerotic heart disease of native coronary artery without angina pectoris: Secondary | ICD-10-CM | POA: Diagnosis not present

## 2024-05-05 DIAGNOSIS — Z0000 Encounter for general adult medical examination without abnormal findings: Secondary | ICD-10-CM | POA: Diagnosis not present

## 2024-05-05 DIAGNOSIS — I10 Essential (primary) hypertension: Secondary | ICD-10-CM | POA: Diagnosis not present

## 2024-06-11 DIAGNOSIS — R1084 Generalized abdominal pain: Secondary | ICD-10-CM | POA: Diagnosis not present

## 2024-06-11 DIAGNOSIS — K625 Hemorrhage of anus and rectum: Secondary | ICD-10-CM | POA: Diagnosis not present

## 2024-06-11 DIAGNOSIS — R634 Abnormal weight loss: Secondary | ICD-10-CM | POA: Diagnosis not present

## 2024-06-16 ENCOUNTER — Other Ambulatory Visit: Payer: Self-pay | Admitting: Gastroenterology

## 2024-06-16 DIAGNOSIS — R109 Unspecified abdominal pain: Secondary | ICD-10-CM

## 2024-06-20 ENCOUNTER — Inpatient Hospital Stay
Admission: RE | Admit: 2024-06-20 | Discharge: 2024-06-20 | Payer: Self-pay | Attending: Gastroenterology | Admitting: Gastroenterology

## 2024-06-20 DIAGNOSIS — K760 Fatty (change of) liver, not elsewhere classified: Secondary | ICD-10-CM | POA: Diagnosis not present

## 2024-06-20 DIAGNOSIS — R109 Unspecified abdominal pain: Secondary | ICD-10-CM | POA: Diagnosis not present

## 2024-06-20 MED ORDER — IOPAMIDOL (ISOVUE-300) INJECTION 61%
100.0000 mL | Freq: Once | INTRAVENOUS | Status: AC | PRN
  Administered 2024-06-20: 100 mL via INTRAVENOUS
